# Patient Record
Sex: Male | Born: 1970 | Race: White | Hispanic: No | Marital: Married | Smoking: Never smoker
Health system: Southern US, Community
[De-identification: ages and names within clinical notes are randomized; demographics above are authoritative.]

## PROBLEM LIST (undated history)

## (undated) DIAGNOSIS — F419 Anxiety disorder, unspecified: Secondary | ICD-10-CM

## (undated) DIAGNOSIS — I10 Essential (primary) hypertension: Secondary | ICD-10-CM

## (undated) HISTORY — PX: FOOT TENDON SURGERY: SHX958

## (undated) HISTORY — PX: OTHER SURGICAL HISTORY: SHX169

## (undated) HISTORY — PX: KNEE SURGERY: SHX244

## (undated) HISTORY — PX: SHOULDER SURGERY: SHX246

## (undated) HISTORY — PX: ROTATOR CUFF REPAIR: SHX139

## (undated) HISTORY — PX: BACK SURGERY: SHX140

---

## 2015-12-07 ENCOUNTER — Encounter (HOSPITAL_COMMUNITY): Payer: Self-pay | Admitting: Emergency Medicine

## 2015-12-07 ENCOUNTER — Emergency Department (HOSPITAL_COMMUNITY): Payer: BLUE CROSS/BLUE SHIELD

## 2015-12-07 ENCOUNTER — Emergency Department (HOSPITAL_COMMUNITY)
Admission: EM | Admit: 2015-12-07 | Discharge: 2015-12-07 | Disposition: A | Discharge status: Home / Self Care | Payer: BLUE CROSS/BLUE SHIELD | Attending: Emergency Medicine | Admitting: Emergency Medicine

## 2015-12-07 DIAGNOSIS — Z79899 Other long term (current) drug therapy: Secondary | ICD-10-CM | POA: Insufficient documentation

## 2015-12-07 DIAGNOSIS — Y9355 Activity, bike riding: Secondary | ICD-10-CM | POA: Insufficient documentation

## 2015-12-07 DIAGNOSIS — Y92410 Unspecified street and highway as the place of occurrence of the external cause: Secondary | ICD-10-CM | POA: Diagnosis not present

## 2015-12-07 DIAGNOSIS — S50311A Abrasion of right elbow, initial encounter: Secondary | ICD-10-CM | POA: Diagnosis not present

## 2015-12-07 DIAGNOSIS — S50811A Abrasion of right forearm, initial encounter: Secondary | ICD-10-CM | POA: Insufficient documentation

## 2015-12-07 DIAGNOSIS — I10 Essential (primary) hypertension: Secondary | ICD-10-CM | POA: Insufficient documentation

## 2015-12-07 DIAGNOSIS — S0081XA Abrasion of other part of head, initial encounter: Secondary | ICD-10-CM | POA: Diagnosis not present

## 2015-12-07 DIAGNOSIS — T148 Other injury of unspecified body region: Secondary | ICD-10-CM | POA: Diagnosis not present

## 2015-12-07 DIAGNOSIS — Y998 Other external cause status: Secondary | ICD-10-CM | POA: Insufficient documentation

## 2015-12-07 DIAGNOSIS — F419 Anxiety disorder, unspecified: Secondary | ICD-10-CM | POA: Diagnosis not present

## 2015-12-07 DIAGNOSIS — T07XXXA Unspecified multiple injuries, initial encounter: Secondary | ICD-10-CM

## 2015-12-07 DIAGNOSIS — S0993XA Unspecified injury of face, initial encounter: Secondary | ICD-10-CM | POA: Diagnosis present

## 2015-12-07 HISTORY — DX: Anxiety disorder, unspecified: F41.9

## 2015-12-07 HISTORY — DX: Essential (primary) hypertension: I10

## 2015-12-07 MED ORDER — MORPHINE SULFATE (PF) 4 MG/ML IV SOLN
6.0000 mg | Freq: Once | INTRAVENOUS | Status: AC
  Administered 2015-12-07: 6 mg via INTRAVENOUS
  Filled 2015-12-07: qty 2

## 2015-12-07 MED ORDER — NAPROXEN 375 MG PO TABS: 375.0000 mg | ORAL_TABLET | Freq: Two times a day (BID) | ORAL | Status: AC | PRN

## 2015-12-07 MED ORDER — OXYCODONE-ACETAMINOPHEN 5-325 MG PO TABS: 1.0000 | ORAL_TABLET | ORAL | Status: AC | PRN

## 2015-12-07 MED ORDER — SODIUM CHLORIDE 0.9 % IV BOLUS (SEPSIS)
1000.0000 mL | Freq: Once | INTRAVENOUS | Status: AC
  Administered 2015-12-07: 1000 mL via INTRAVENOUS

## 2015-12-07 MED ORDER — KETOROLAC TROMETHAMINE 15 MG/ML IJ SOLN
15.0000 mg | Freq: Once | INTRAMUSCULAR | Status: AC
  Administered 2015-12-07: 15 mg via INTRAVENOUS
  Filled 2015-12-07: qty 1
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
## 2015-12-07 NOTE — Discharge Instructions (Signed)

## 2015-12-07 NOTE — ED Notes (Signed)
Patient transported to radiology
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
